# Patient Record
Sex: Female | Born: 2000 | Race: Black or African American | Hispanic: No | Marital: Single | Smoking: Never smoker
Health system: Southern US, Community
[De-identification: ages and names within clinical notes are randomized; demographics above are authoritative.]

---
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
## 2019-10-09 ENCOUNTER — Ambulatory Visit
Admission: EM | Admit: 2019-10-09 | Discharge: 2019-10-09 | Disposition: A | Discharge status: Home / Self Care | Payer: PRIVATE HEALTH INSURANCE | Attending: Physician Assistant | Admitting: Physician Assistant

## 2019-10-09 ENCOUNTER — Encounter: Payer: Self-pay | Admitting: Emergency Medicine

## 2019-10-09 ENCOUNTER — Ambulatory Visit: Payer: Self-pay | Admitting: *Deleted

## 2019-10-09 ENCOUNTER — Other Ambulatory Visit: Payer: Self-pay

## 2019-10-09 DIAGNOSIS — R0789 Other chest pain: Secondary | ICD-10-CM | POA: Diagnosis not present

## 2019-10-09 DIAGNOSIS — R0989 Other specified symptoms and signs involving the circulatory and respiratory systems: Secondary | ICD-10-CM

## 2019-10-09 MED ORDER — FLUTICASONE PROPIONATE 50 MCG/ACT NA SUSP: 2.0000 | Freq: Every day | NASAL | 0 refills | Status: AC

## 2019-10-09 MED ORDER — ALBUTEROL SULFATE HFA 108 (90 BASE) MCG/ACT IN AERS: INHALATION_SPRAY | RESPIRATORY_TRACT | 0 refills | Status: AC

## 2019-10-09 NOTE — Discharge Instructions (Signed)
No alarming signs on exam. Start with albuterol inhaler before working out to see if you have any relief. Start flonase for possible post nasal drip causing symptoms. Avoid eating within 2 hours of exercising. If needing, can add over the counter pepcid/omeprazole for possible acid reflux causing symptoms. Follow up with PCP for further evaluation if symptoms not improving. If passing out during exercising, go to the ED for further evaluation needed.

## 2019-10-09 NOTE — Telephone Encounter (Signed)
Inhaling causes pain in chest and throat.  Call to patient- patient states she has SOB with exertion and taking deep breath causes chest pain. Patient does not have established PCP and she needs to be seen with 4 hours per protocol. Advised UC for this episode and follow up NP appointment for follow up care.  Reason for Disposition . [1] MILD difficulty breathing (e.g., minimal/no SOB at rest, SOB with walking, pulse <100) AND [2] NEW-onset or WORSE than normal  Answer Assessment - Initial Assessment Questions 1. RESPIRATORY STATUS: "Describe your breathing?" (e.g., wheezing, shortness of breath, unable to speak, severe coughing)      Feels like shallow breathing 2. ONSET: "When did this breathing problem begin?"      A couple months- it flares up. This episode started Sunday 3. PATTERN "Does the difficult breathing come and go, or has it been constant since it started?"      constant 4. SEVERITY: "How bad is your breathing?" (e.g., mild, moderate, severe)    - MILD: No SOB at rest, mild SOB with walking, speaks normally in sentences, can lay down, no retractions, pulse < 100.    - MODERATE: SOB at rest, SOB with minimal exertion and prefers to sit, cannot lie down flat, speaks in phrases, mild retractions, audible wheezing, pulse 100-120.    - SEVERE: Very SOB at rest, speaks in single words, struggling to breathe, sitting hunched forward, retractions, pulse > 120      Mild- wants to be seen before it gets worse 5. RECURRENT SYMPTOM: "Have you had difficulty breathing before?" If so, ask: "When was the last time?" and "What happened that time?"      No past breathing problems- just started 1 month ago 6. CARDIAC HISTORY: "Do you have any history of heart disease?" (e.g., heart attack, angina, bypass surgery, angioplasty)      no 7. LUNG HISTORY: "Do you have any history of lung disease?"  (e.g., pulmonary embolus, asthma, emphysema)     no 8. CAUSE: "What do you think is causing the  breathing problem?"      Occurs when she exercises a lot- she has consistently exercises- patient is taking vitamin suppliments 9. OTHER SYMPTOMS: "Do you have any other symptoms? (e.g., dizziness, runny nose, cough, chest pain, fever)     Chest pain with inhalation 10. PREGNANCY: "Is there any chance you are pregnant?" "When was your last menstrual period?"       No- LMP-2 weeks ago 11. TRAVEL: "Have you traveled out of the country in the last month?" (e.g., travel history, exposures)       no  Protocols used: BREATHING DIFFICULTY-A-AH

## 2019-10-09 NOTE — ED Provider Notes (Signed)
[facility]    CSN: [id number] Arrival date & time: 10/09/19  1549      History   Chief Complaint Chief Complaint  Patient presents with   Chest Pain    HPI Jade Mcbride is a 18 y.o. female.   18 year old female comes in for few month history of intermittent chest/throat pain.  States these episodes typically start after exercising, where she feels like she is not taking as deep breaths normally.  This usually lasts a few hours to days before resolving on own.  States has noticed that occurring more significantly if she eats prior to exercising.  She denies any nausea, vomiting, abdominal pain.  Denies burning sensation to the chest/epigastric area.  Denies exertional fatigue, syncope, lightheadedness.  Denies URI symptoms such as cough, congestion, sore throat.  Denies fever, chills, body aches.  Denies loss of taste or smell.  Denies personal or family history of asthma/lung disease.  Denies personal or family history of heart disease.  Never smoker.  Has not tried anything for the symptoms.  States had a particularly bad episode 2 to 3 days ago, and therefore came in for evaluation today.     History reviewed. No pertinent past medical history.  There are no active problems to display for this patient.   History reviewed. No pertinent surgical history.  OB History   No obstetric history on file.      Home Medications    Prior to Admission medications   Medication Sig Start Date End Date Taking? Authorizing Provider  albuterol (VENTOLIN HFA) 108 (90 Base) MCG/ACT inhaler 2 puffs before exercise. Every 4-6 hours as needed. 10/09/19   Cathie Hoops, Celene Pippins V, PA-C  fluticasone (FLONASE) 50 MCG/ACT nasal spray Place 2 sprays into both nostrils daily. 10/09/19   Belinda Fisher, PA-C    Family History Family History  Problem Relation Age of Onset   Healthy Mother    Hypertension Father     Social History Social History   Tobacco Use   Smoking status: Never Smoker     Smokeless tobacco: Never Used  Substance Use Topics   Alcohol use: Never    Frequency: Never   Drug use: Never     Allergies   Patient has no known allergies.   Review of Systems Review of Systems  Reason unable to perform ROS: See HPI as above.     Physical Exam Triage Vital Signs ED Triage Vitals [10/09/19 1600]  Enc Vitals Group     BP 122/79     Pulse Rate 75     Resp 16     Temp 98 F (36.7 C)     Temp Source Temporal     SpO2 98 %     Weight      Height      Head Circumference      Peak Flow      Pain Score 0     Pain Loc      Pain Edu?      Excl. in GC?    No data found.  Updated Vital Signs BP 122/79 (BP Location: Left Arm)    Pulse 75    Temp 98 F (36.7 C) (Temporal)    Resp 16    LMP 09/24/2019    SpO2 98%   Physical Exam Constitutional:      General: She is not in acute distress.    Appearance: Normal appearance. She is not ill-appearing, toxic-appearing or diaphoretic.  HENT:  Head: Normocephalic and atraumatic.     Mouth/Throat:     Mouth: Mucous membranes are moist.     Pharynx: Oropharynx is clear. Uvula midline. No pharyngeal swelling, posterior oropharyngeal erythema or uvula swelling.  Neck:     Musculoskeletal: Normal range of motion and neck supple.     Thyroid: No thyroid mass, thyromegaly or thyroid tenderness.  Cardiovascular:     Rate and Rhythm: Normal rate and regular rhythm.     Heart sounds: Normal heart sounds. No murmur. No friction rub. No gallop.   Pulmonary:     Effort: Pulmonary effort is normal. No accessory muscle usage, prolonged expiration, respiratory distress or retractions.     Comments: Lungs clear to auscultation without adventitious lung sounds. Chest:     Chest wall: No tenderness.  Abdominal:     General: Abdomen is flat. Bowel sounds are normal.     Palpations: Abdomen is soft.     Tenderness: There is no abdominal tenderness. There is no right CVA tenderness, left CVA tenderness, guarding or  rebound.     Hernia: No hernia is present.  Lymphadenopathy:     Cervical: No cervical adenopathy.  Neurological:     General: No focal deficit present.     Mental Status: She is alert and oriented to person, place, and time.      UC Treatments / Results  Labs (all labs ordered are listed, but only abnormal results are displayed) Labs Reviewed - No data to display  EKG   Radiology No results found.  Procedures Procedures (including critical care time)  Medications Ordered in UC Medications - No data to display  Initial Impression / Assessment and Plan / UC Course  I have reviewed the triage vital signs and the nursing notes.  Pertinent labs & imaging results that were available during my care of the patient were reviewed by me and considered in my medical decision making (see chart for details).    No alarming signs on exam.  Patient without current chest pain or shortness of breath.  Given symptoms associated with exercising, will start with albuterol inhaler at this time for possible exercise-induced asthma.  However, given worsening with eating prior to exercise, will also have patient monitor for acid reflux, and can start over-the-counter Pepcid or omeprazole for symptoms.  Given occasional globus sensation, will have patient start Flonase for possible postnasal drip.  Return precautions given.  Patient expresses understanding and agrees to plan.  Final Clinical Impressions(s) / UC Diagnoses   Final diagnoses:  Atypical chest pain  Globus sensation   ED Prescriptions    Medication Sig Dispense Auth. Provider   albuterol (VENTOLIN HFA) 108 (90 Base) MCG/ACT inhaler 2 puffs before exercise. Every 4-6 hours as needed. 8 g Thaer Miyoshi V, PA-C   fluticasone (FLONASE) 50 MCG/ACT nasal spray Place 2 sprays into both nostrils daily. 1 g Ok Edwards, PA-C     PDMP not reviewed this encounter.   Ok Edwards, PA-C 10/09/19 1653

## 2019-10-09 NOTE — ED Triage Notes (Signed)
Pt presents to [facility] for several months of upper sharp chest/lower throat pain.  Patient states this happens when she takes a really deep breath.
